# Patient Record
Sex: Female | Born: 1950 | Race: White | Hispanic: No | Marital: Married | Smoking: Former smoker
Health system: Southern US, Community
[De-identification: ages and names within clinical notes are randomized; demographics above are authoritative.]

## PROBLEM LIST (undated history)

## (undated) DIAGNOSIS — Z974 Presence of external hearing-aid: Secondary | ICD-10-CM

## (undated) DIAGNOSIS — E785 Hyperlipidemia, unspecified: Secondary | ICD-10-CM

## (undated) DIAGNOSIS — I10 Essential (primary) hypertension: Secondary | ICD-10-CM

---

## 2004-02-13 ENCOUNTER — Ambulatory Visit: Payer: Self-pay | Admitting: Internal Medicine

## 2004-02-15 ENCOUNTER — Ambulatory Visit: Payer: Self-pay | Admitting: Internal Medicine

## 2004-03-05 ENCOUNTER — Ambulatory Visit: Payer: Self-pay | Admitting: Internal Medicine

## 2005-03-20 ENCOUNTER — Ambulatory Visit: Payer: Self-pay | Admitting: Internal Medicine

## 2006-03-25 ENCOUNTER — Ambulatory Visit: Payer: Self-pay | Admitting: Internal Medicine

## 2006-04-17 ENCOUNTER — Ambulatory Visit: Payer: Self-pay | Admitting: Unknown Physician Specialty

## 2006-07-09 ENCOUNTER — Ambulatory Visit: Payer: Self-pay | Admitting: Internal Medicine

## 2006-07-11 ENCOUNTER — Ambulatory Visit: Payer: Self-pay | Admitting: Internal Medicine

## 2006-08-11 ENCOUNTER — Ambulatory Visit: Payer: Self-pay | Admitting: Internal Medicine

## 2007-07-22 ENCOUNTER — Ambulatory Visit: Payer: Self-pay | Admitting: Internal Medicine

## 2008-07-25 ENCOUNTER — Ambulatory Visit: Payer: Self-pay | Admitting: Internal Medicine

## 2009-07-26 ENCOUNTER — Ambulatory Visit: Payer: Self-pay | Admitting: Internal Medicine

## 2010-08-30 ENCOUNTER — Ambulatory Visit: Payer: Self-pay | Admitting: Internal Medicine

## 2011-09-02 ENCOUNTER — Ambulatory Visit: Payer: Self-pay | Admitting: Internal Medicine

## 2012-09-07 ENCOUNTER — Ambulatory Visit: Payer: Self-pay | Admitting: Internal Medicine

## 2013-10-06 ENCOUNTER — Ambulatory Visit: Payer: Self-pay | Admitting: Internal Medicine

## 2014-09-15 ENCOUNTER — Other Ambulatory Visit: Payer: Self-pay | Admitting: Internal Medicine

## 2014-09-15 DIAGNOSIS — Z1231 Encounter for screening mammogram for malignant neoplasm of breast: Secondary | ICD-10-CM

## 2014-10-11 ENCOUNTER — Ambulatory Visit
Admission: RE | Admit: 2014-10-11 | Discharge: 2014-10-11 | Disposition: A | Discharge status: Home / Self Care | Payer: BC Managed Care – PPO | Source: Ambulatory Visit | Attending: Internal Medicine | Admitting: Internal Medicine

## 2014-10-11 DIAGNOSIS — R928 Other abnormal and inconclusive findings on diagnostic imaging of breast: Secondary | ICD-10-CM | POA: Diagnosis not present

## 2014-10-11 DIAGNOSIS — Z1231 Encounter for screening mammogram for malignant neoplasm of breast: Secondary | ICD-10-CM | POA: Insufficient documentation

## 2014-10-13 ENCOUNTER — Other Ambulatory Visit: Payer: Self-pay | Admitting: Internal Medicine

## 2014-10-13 DIAGNOSIS — R921 Mammographic calcification found on diagnostic imaging of breast: Secondary | ICD-10-CM

## 2014-10-13 DIAGNOSIS — R928 Other abnormal and inconclusive findings on diagnostic imaging of breast: Secondary | ICD-10-CM

## 2014-10-20 ENCOUNTER — Other Ambulatory Visit: Payer: Self-pay | Admitting: Internal Medicine

## 2014-10-20 ENCOUNTER — Ambulatory Visit
Admission: RE | Admit: 2014-10-20 | Discharge: 2014-10-20 | Disposition: A | Discharge status: Home / Self Care | Payer: BC Managed Care – PPO | Source: Ambulatory Visit | Attending: Internal Medicine | Admitting: Internal Medicine

## 2014-10-20 ENCOUNTER — Ambulatory Visit: Payer: BC Managed Care – PPO

## 2014-10-20 DIAGNOSIS — R921 Mammographic calcification found on diagnostic imaging of breast: Secondary | ICD-10-CM

## 2014-10-20 DIAGNOSIS — R928 Other abnormal and inconclusive findings on diagnostic imaging of breast: Secondary | ICD-10-CM

## 2014-10-24 ENCOUNTER — Other Ambulatory Visit: Payer: Self-pay | Admitting: Internal Medicine

## 2014-10-24 DIAGNOSIS — R928 Other abnormal and inconclusive findings on diagnostic imaging of breast: Secondary | ICD-10-CM

## 2014-10-24 DIAGNOSIS — R921 Mammographic calcification found on diagnostic imaging of breast: Secondary | ICD-10-CM

## 2014-10-26 ENCOUNTER — Other Ambulatory Visit: Payer: Self-pay | Admitting: Surgery

## 2014-10-26 DIAGNOSIS — R928 Other abnormal and inconclusive findings on diagnostic imaging of breast: Secondary | ICD-10-CM

## 2014-10-26 DIAGNOSIS — R921 Mammographic calcification found on diagnostic imaging of breast: Secondary | ICD-10-CM

## 2014-10-30 ENCOUNTER — Ambulatory Visit: Payer: BC Managed Care – PPO

## 2014-11-06 ENCOUNTER — Other Ambulatory Visit: Payer: Self-pay | Admitting: Surgery

## 2014-11-06 ENCOUNTER — Ambulatory Visit
Admission: RE | Admit: 2014-11-06 | Discharge: 2014-11-06 | Disposition: A | Discharge status: Home / Self Care | Payer: BC Managed Care – PPO | Source: Ambulatory Visit | Attending: Surgery | Admitting: Surgery

## 2014-11-06 DIAGNOSIS — R928 Other abnormal and inconclusive findings on diagnostic imaging of breast: Secondary | ICD-10-CM

## 2014-11-06 DIAGNOSIS — R921 Mammographic calcification found on diagnostic imaging of breast: Secondary | ICD-10-CM

## 2014-11-06 DIAGNOSIS — R920 Mammographic microcalcification found on diagnostic imaging of breast: Secondary | ICD-10-CM | POA: Diagnosis present

## 2014-11-06 HISTORY — PX: BREAST BIOPSY: SHX20

## 2014-11-07 LAB — SURGICAL PATHOLOGY

## 2015-10-18 ENCOUNTER — Other Ambulatory Visit: Payer: Self-pay | Admitting: Internal Medicine

## 2015-10-18 DIAGNOSIS — Z1231 Encounter for screening mammogram for malignant neoplasm of breast: Secondary | ICD-10-CM

## 2015-11-01 ENCOUNTER — Other Ambulatory Visit: Payer: Self-pay | Admitting: Internal Medicine

## 2015-11-01 ENCOUNTER — Ambulatory Visit
Admission: RE | Admit: 2015-11-01 | Discharge: 2015-11-01 | Disposition: A | Discharge status: Home / Self Care | Payer: Medicare Other | Source: Ambulatory Visit | Attending: Internal Medicine | Admitting: Internal Medicine

## 2015-11-01 DIAGNOSIS — Z1231 Encounter for screening mammogram for malignant neoplasm of breast: Secondary | ICD-10-CM

## 2015-12-05 IMAGING — MG MM DIGITAL DIAGNOSTIC UNILAT*R*
2 series · 2 of 2 positions shown · non-contrast
Comparison: Previous exam(s).

CLINICAL DATA: Post right breast stereotactic biopsy.

EXAM:
DIAGNOSTIC RIGHT MAMMOGRAM POST STEREOTACTIC BIOPSY

[R CC]
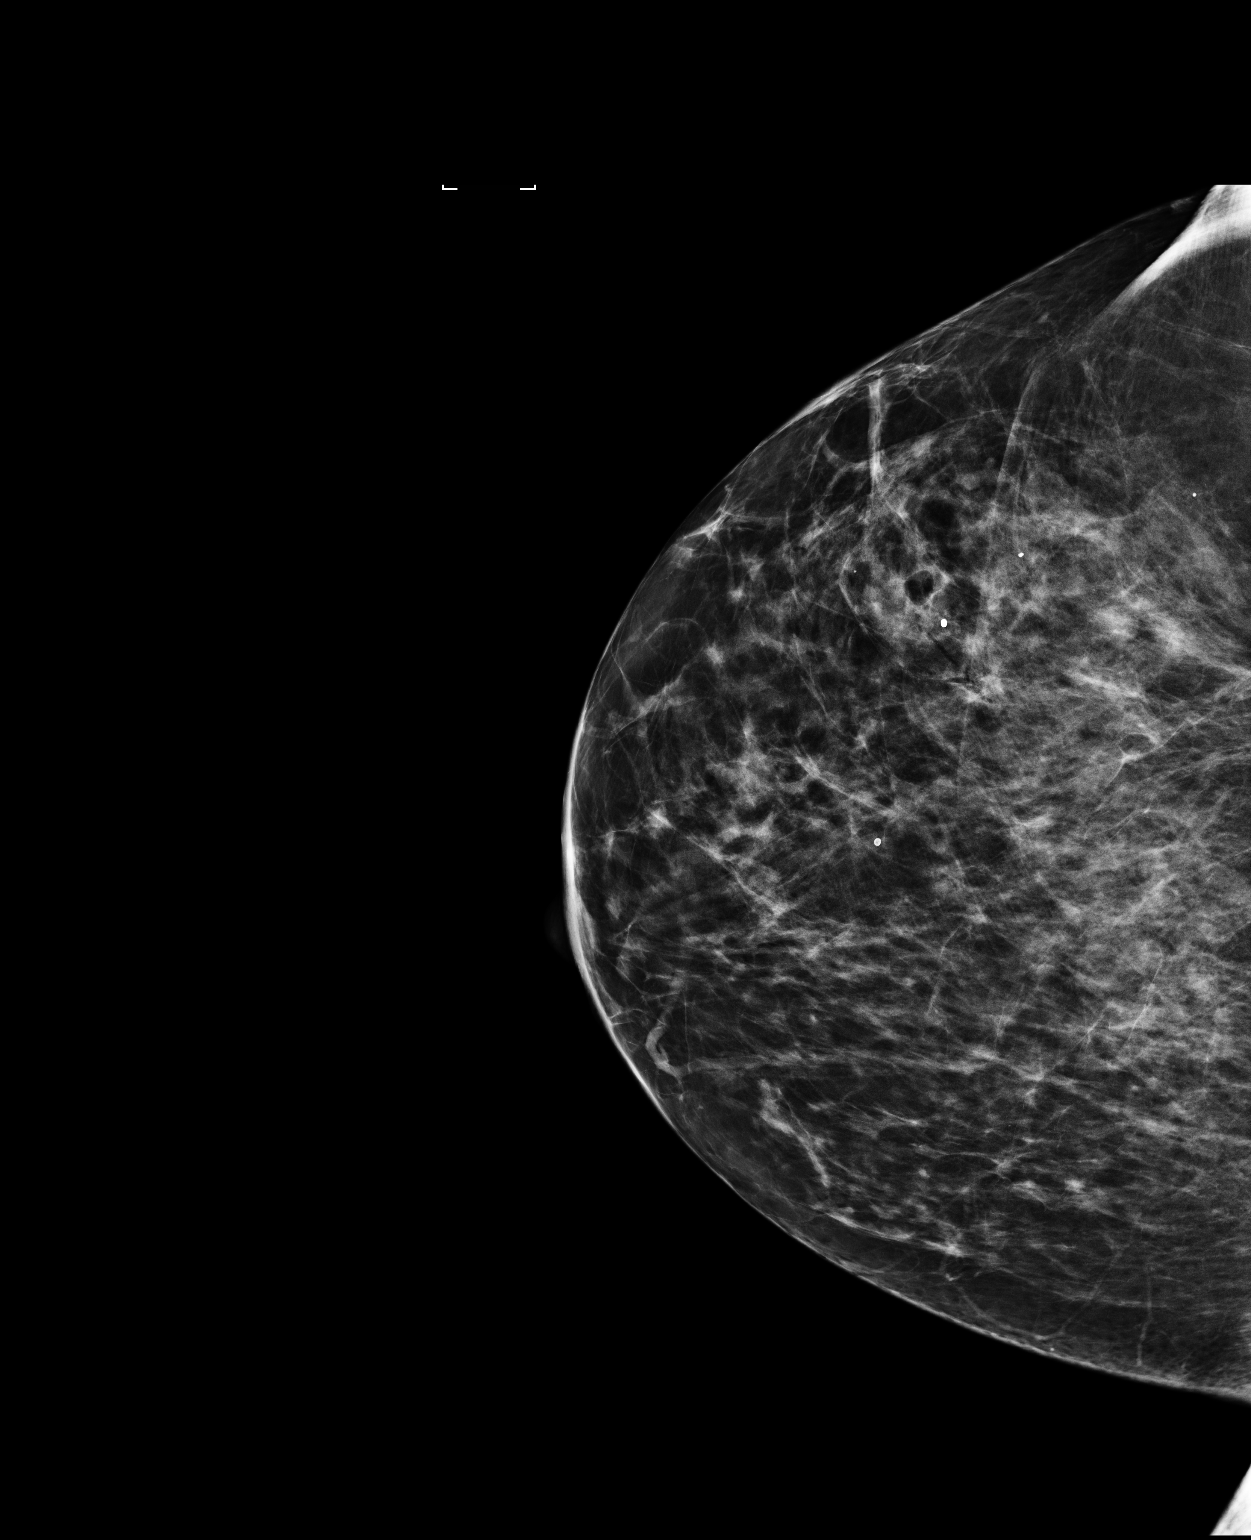

[R ML]
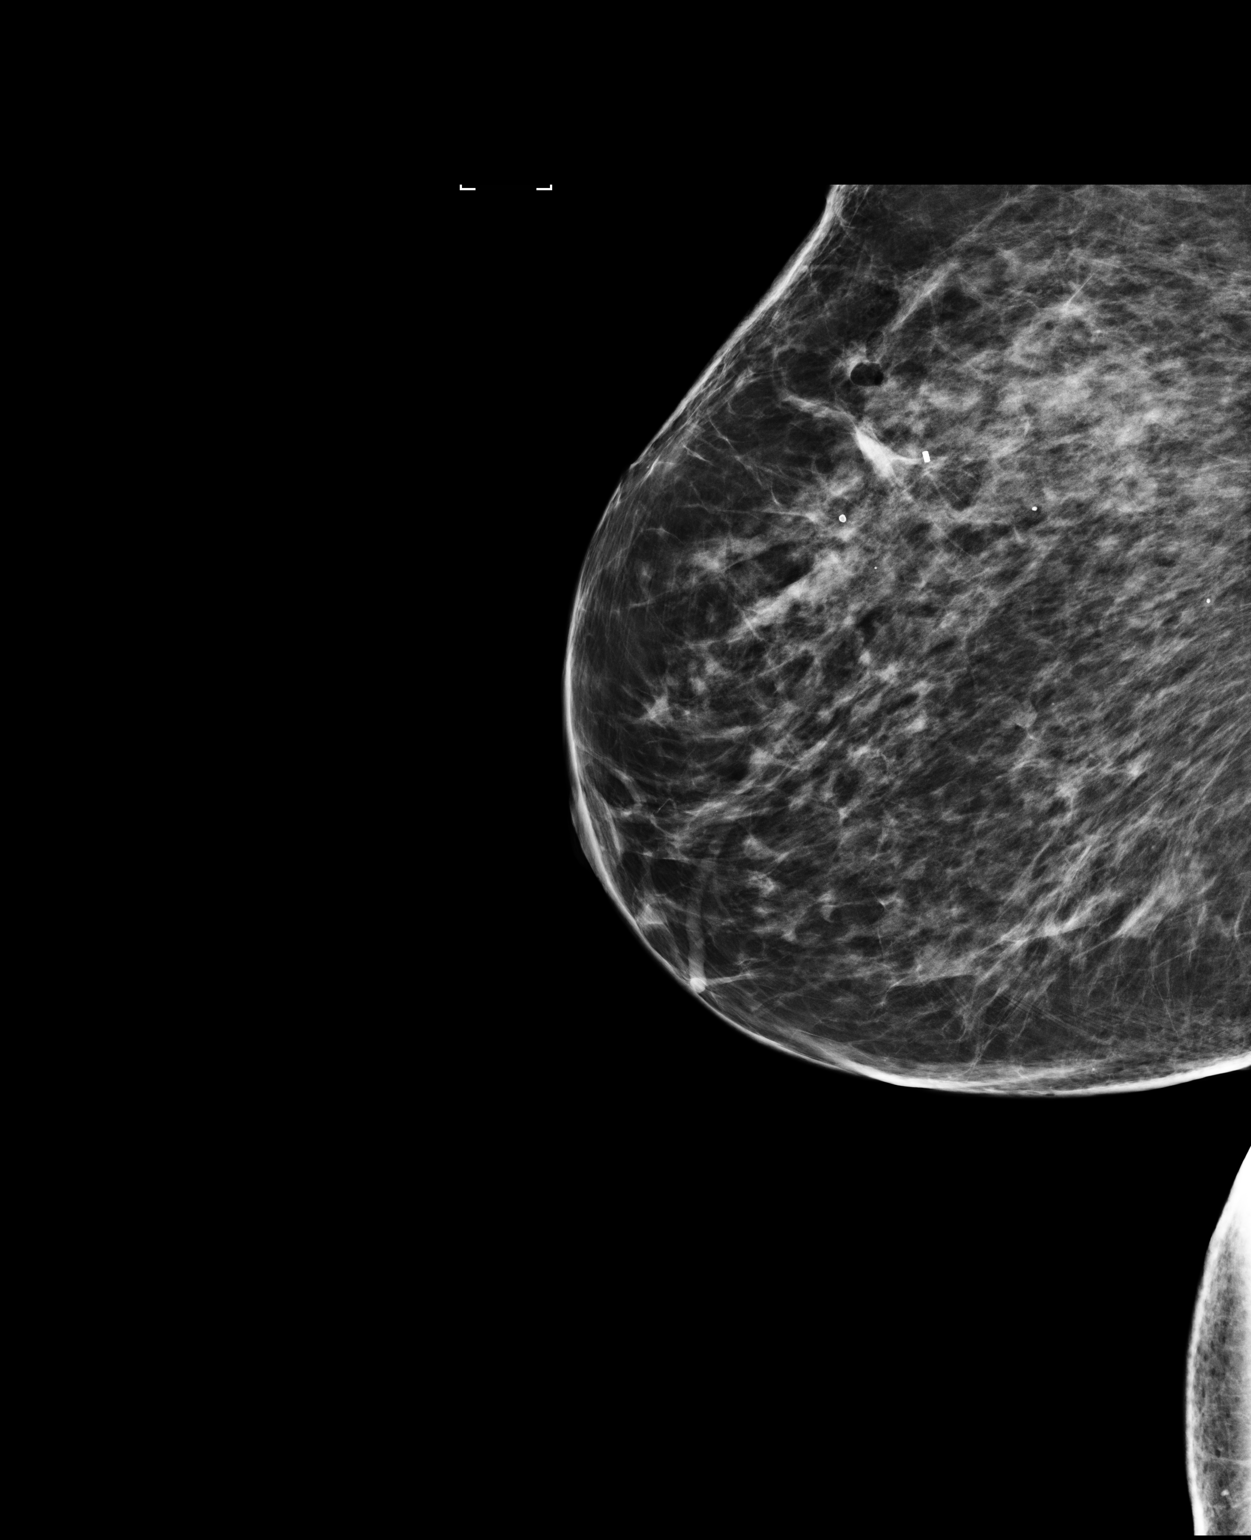

[2 of 2 positions shown; findings below may reference images not displayed]

FINDINGS: Mammographic images were obtained following stereotactic guided
biopsy of right breast calcifications. The cylinder-shaped clip is
in appropriate position
IMPRESSION: Appropriate positioning of clip following right breast stereotactic
biopsy.

Final Assessment: Post Procedure Mammograms for Marker Placement

## 2015-12-05 IMAGING — MG MM BREAST BX W/ LOC DEV 1ST LESION IMAGE BX SPEC STEREO GUIDE*R*
1 series · 1 of 1 positions shown · non-contrast
Comparison: Previous exams.

ADDENDUM:
Pathology of the right breast biopsy revealed FIBROCYSTIC CHANGES
WITH ASSOCIATED MICROCALCIFICATIONS. NEGATIVE FOR ATYPIA AND
MALIGNANCY. Comment: Fibrocystic changes include fibroadenomatous
change, usual ductal hyperplasia, adenosis, pseudo-angiomatous
stromal hyperplasia, apocrine metaplasia, and columnar cell change.
This was found to be concordant by Dr. Malagon.

At the patient's request, pathology and recommendations were relayed
to the patient by phone. She stated she is doing well following the
biopsy with no bleeding, bruising, or palpable hematoma at the
biopsy site. Post biopsy instructions were reviewed with the
patient. She was encouraged to call the [REDACTED] with any further questions or
concerns.
Recommendations: Recommend bilateral screening mammogram in one
year.
Pathology and recommendations relayed to the patient by Alhassan Seidu
[place], Mauro Teixeira on 11/07/14.
CLINICAL DATA: Suspicious right breast calcifications.
EXAM:
RIGHT BREAST STEREOTACTIC CORE NEEDLE BIOPSY

[R CC]
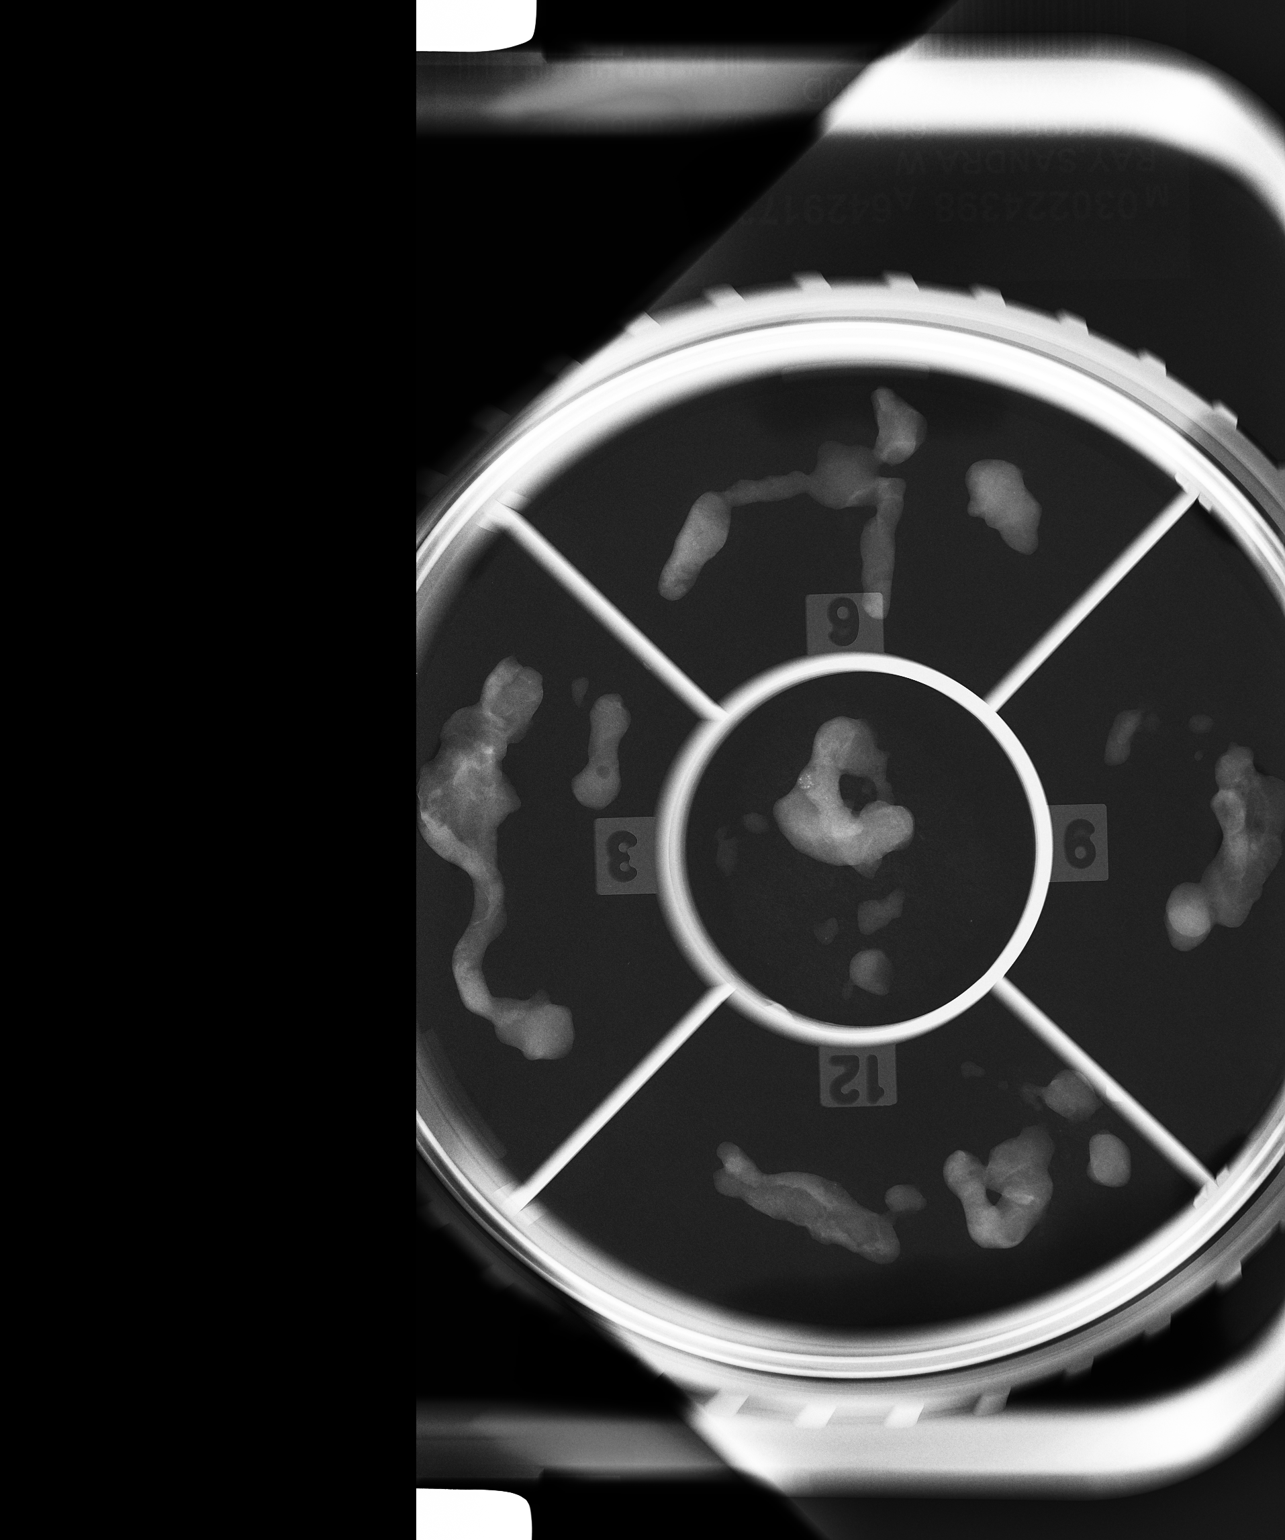

[1 of 1 positions shown; findings below may reference images not displayed]



Using sterile technique and 2% lidocaine as local anesthetic, under
stereotactic guidance, a 9 gauge vacuum assisted device was used to
perform core needle biopsy of calcifications within the upper-outer
quadrant of the right breast using a superior craniocaudal approach.
Specimen radiograph was performed showing representative
calcifications within the specimen. Specimens with calcifications
are identified for pathology.

At the conclusion of the procedure, a cylinder-shaped tissue marker
clip was deployed into the biopsy cavity. Follow-up 2-view mammogram
was performed and dictated separately.
IMPRESSION: Stereotactic-guided biopsy of right breast calcifications as
discussed above. No apparent complications.

## 2016-11-06 ENCOUNTER — Other Ambulatory Visit: Payer: Self-pay | Admitting: Internal Medicine

## 2016-11-06 DIAGNOSIS — Z1231 Encounter for screening mammogram for malignant neoplasm of breast: Secondary | ICD-10-CM

## 2016-11-20 ENCOUNTER — Ambulatory Visit
Admission: RE | Admit: 2016-11-20 | Discharge: 2016-11-20 | Disposition: A | Discharge status: Home / Self Care | Payer: Medicare Other | Source: Ambulatory Visit | Attending: Internal Medicine | Admitting: Internal Medicine

## 2016-11-20 DIAGNOSIS — Z1231 Encounter for screening mammogram for malignant neoplasm of breast: Secondary | ICD-10-CM

## 2018-02-05 ENCOUNTER — Other Ambulatory Visit: Payer: Self-pay | Admitting: Internal Medicine

## 2018-02-05 ENCOUNTER — Ambulatory Visit
Admission: RE | Admit: 2018-02-05 | Discharge: 2018-02-05 | Disposition: A | Discharge status: Home / Self Care | Payer: Medicare Other | Source: Ambulatory Visit | Attending: Internal Medicine | Admitting: Internal Medicine

## 2018-02-05 DIAGNOSIS — Z1231 Encounter for screening mammogram for malignant neoplasm of breast: Secondary | ICD-10-CM | POA: Diagnosis present

## 2019-03-01 ENCOUNTER — Other Ambulatory Visit: Payer: Self-pay | Admitting: Internal Medicine

## 2019-03-01 DIAGNOSIS — Z1231 Encounter for screening mammogram for malignant neoplasm of breast: Secondary | ICD-10-CM

## 2019-05-20 ENCOUNTER — Ambulatory Visit
Admission: RE | Admit: 2019-05-20 | Discharge: 2019-05-20 | Disposition: A | Discharge status: Home / Self Care | Payer: Medicare Other | Source: Ambulatory Visit | Attending: Internal Medicine | Admitting: Internal Medicine

## 2019-05-20 DIAGNOSIS — Z1231 Encounter for screening mammogram for malignant neoplasm of breast: Secondary | ICD-10-CM | POA: Insufficient documentation

## 2020-07-24 ENCOUNTER — Other Ambulatory Visit: Payer: Self-pay | Admitting: Internal Medicine

## 2020-07-24 DIAGNOSIS — Z1231 Encounter for screening mammogram for malignant neoplasm of breast: Secondary | ICD-10-CM

## 2020-08-10 ENCOUNTER — Other Ambulatory Visit: Payer: Self-pay

## 2020-08-10 ENCOUNTER — Ambulatory Visit
Admission: RE | Admit: 2020-08-10 | Discharge: 2020-08-10 | Disposition: A | Discharge status: Home / Self Care | Payer: Medicare Other | Source: Ambulatory Visit | Attending: Internal Medicine | Admitting: Internal Medicine

## 2020-08-10 DIAGNOSIS — Z1231 Encounter for screening mammogram for malignant neoplasm of breast: Secondary | ICD-10-CM

## 2021-04-01 LAB — COLOGUARD: COLOGUARD: NEGATIVE

## 2021-07-30 ENCOUNTER — Other Ambulatory Visit: Payer: Self-pay | Admitting: Internal Medicine

## 2021-07-30 DIAGNOSIS — Z1231 Encounter for screening mammogram for malignant neoplasm of breast: Secondary | ICD-10-CM

## 2021-08-27 ENCOUNTER — Ambulatory Visit
Admission: RE | Admit: 2021-08-27 | Discharge: 2021-08-27 | Disposition: A | Discharge status: Home / Self Care | Payer: Medicare Other | Source: Ambulatory Visit | Attending: Internal Medicine | Admitting: Internal Medicine

## 2021-08-27 DIAGNOSIS — Z1231 Encounter for screening mammogram for malignant neoplasm of breast: Secondary | ICD-10-CM | POA: Diagnosis present

## 2021-11-25 ENCOUNTER — Ambulatory Visit
Admission: EM | Admit: 2021-11-25 | Discharge: 2021-11-25 | Disposition: A | Discharge status: Home / Self Care | Payer: Medicare Other | Attending: Internal Medicine | Admitting: Internal Medicine

## 2021-11-25 DIAGNOSIS — N3000 Acute cystitis without hematuria: Secondary | ICD-10-CM | POA: Insufficient documentation

## 2021-11-25 LAB — URINALYSIS, ROUTINE W REFLEX MICROSCOPIC

## 2021-11-25 LAB — URINALYSIS, MICROSCOPIC (REFLEX): WBC, UA: >50 WBC/hpf (ref 0–5)

## 2021-11-25 MED ORDER — NITROFURANTOIN MONOHYD MACRO 100 MG PO CAPS: 100.0000 mg | ORAL_CAPSULE | Freq: Two times a day (BID) | ORAL | 0 refills | Status: DC

## 2021-11-25 NOTE — ED Provider Notes (Signed)
[facility]    CSN: [id number] Arrival date & time: 11/25/21  1844      History   Chief Complaint Chief Complaint  Patient presents with   Urinary Tract Infection    HPI Kelsey Hess is a 71 y.o. female who presents with urinary frequency and dysuria x 1 day. Last UTI April this year.     History reviewed. No pertinent past medical history.  There are no problems to display for this patient.   Past Surgical History:  Procedure Laterality Date   BREAST BIOPSY Right 11/06/2014   Stereo - negative    OB History   No obstetric history on file.      Home Medications    Prior to Admission medications   Medication Sig Start Date End Date Taking? Authorizing Provider  amLODipine (NORVASC) 10 MG tablet  11/25/21  Yes [provider]  calcium carbonate (OSCAL) 1500 (600 Ca) MG TABS tablet Take by mouth.   Yes [provider]  Cholecalciferol (VITAMIN D3) 10 MCG (400 UNIT) CAPS  11/25/21  Yes [provider]  losartan-hydrochlorothiazide (HYZAAR) 100-12.5 MG tablet  11/25/21  Yes [provider]  lovastatin (MEVACOR) 40 MG tablet TAKE 1 TABLET BY MOUTH  DAILY WITH DINNER 07/10/14  Yes [provider]  nitrofurantoin, macrocrystal-monohydrate, (MACROBID) 100 MG capsule Take 1 capsule (100 mg total) by mouth 2 (two) times daily. 11/25/21  Yes Rodriguez-Southworth, Nettie Elm, PA-C  vitamin B-12 (CYANOCOBALAMIN) 1000 MCG tablet Take by mouth.   Yes [provider]    Family History Family History  Problem Relation Age of Onset   Breast cancer Mother 65    Social History Social History   Tobacco Use   Smoking status: Never   Smokeless tobacco: Never  Vaping Use   Vaping Use: Never used  Substance Use Topics   Alcohol use: Yes   Drug use: Never     Allergies   Patient has no allergy information on record.   Review of Systems Review of Systems  Constitutional:  Negative for fever.  Genitourinary:   Positive for dysuria and frequency. Negative for flank pain.     Physical Exam Triage Vital Signs ED Triage Vitals  Enc Vitals Group     BP 11/25/21 1904 (!) 157/79     Pulse Rate 11/25/21 1904 94     Resp 11/25/21 1904 18     Temp 11/25/21 1904 98.4 F (36.9 C)     Temp Source 11/25/21 1904 Oral     SpO2 11/25/21 1904 96 %     Weight 11/25/21 1901 176 lb (79.8 kg)     Height 11/25/21 1901 4\' 11"  (1.499 m)     Head Circumference --      Peak Flow --      Pain Score 11/25/21 1901 8     Pain Loc --      Pain Edu? --      Excl. in GC? --    No data found.  Updated Vital Signs BP (!) 157/79 (BP Location: Left Arm)   Pulse 94   Temp 98.4 F (36.9 C) (Oral)   Resp 18   Ht 4\' 11"  (1.499 m)   Wt 176 lb (79.8 kg)   SpO2 96%   BMI 35.55 kg/m   Visual Acuity Right Eye Distance:   Left Eye Distance:   Bilateral Distance:    Right Eye Near:   Left Eye Near:    Bilateral Near:  Physical Exam Vitals and nursing note reviewed.  Constitutional:      General: She is not in acute distress.    Appearance: She is not toxic-appearing.  HENT:     Head: Normocephalic.     Right Ear: External ear normal.     Left Ear: External ear normal.  Eyes:     General: No scleral icterus.    Conjunctiva/sclera: Conjunctivae normal.  Pulmonary:     Effort: Pulmonary effort is normal.  Abdominal:     General: Bowel sounds are normal.     Palpations: Abdomen is soft. There is no mass.     Tenderness: There is no guarding or rebound.     Comments: - CVA tenderness   Musculoskeletal:        General: Normal range of motion.     Cervical back: Neck supple.      Skin:    General: Skin is warm and dry.     Findings: No rash.  Neurological:     Mental Status: She is alert and oriented to person, place, and time.     Gait: Gait normal.  Psychiatric:        Mood and Affect: Mood normal.        Behavior: Behavior normal.        Thought Content: Thought content normal.         Judgment: Judgment normal.    UC Treatments / Results  Labs (all labs ordered are listed, but only abnormal results are displayed) Labs Reviewed  URINALYSIS, ROUTINE W REFLEX MICROSCOPIC - Abnormal; Notable for the following components:      Result Value   Color, Urine ORANGE (*)    APPearance HAZY (*)    Glucose, UA   (*)    Value: TEST NOT REPORTED DUE TO COLOR INTERFERENCE OF URINE PIGMENT   Hgb urine dipstick   (*)    Value: TEST NOT REPORTED DUE TO COLOR INTERFERENCE OF URINE PIGMENT   Bilirubin Urine   (*)    Value: TEST NOT REPORTED DUE TO COLOR INTERFERENCE OF URINE PIGMENT   Ketones, ur   (*)    Value: TEST NOT REPORTED DUE TO COLOR INTERFERENCE OF URINE PIGMENT   Protein, ur   (*)    Value: TEST NOT REPORTED DUE TO COLOR INTERFERENCE OF URINE PIGMENT   Nitrite   (*)    Value: TEST NOT REPORTED DUE TO COLOR INTERFERENCE OF URINE PIGMENT   Leukocytes,Ua   (*)    Value: TEST NOT REPORTED DUE TO COLOR INTERFERENCE OF URINE PIGMENT   All other components within normal limits  URINALYSIS, MICROSCOPIC (REFLEX) - Abnormal; Notable for the following components:   Bacteria, UA MANY (*)    All other components within normal limits  URINE CULTURE    EKG   Radiology No results found.  Procedures Procedures (including critical care time)  Medications Ordered in UC Medications - No data to display  Initial Impression / Assessment and Plan / UC Course  I have reviewed the triage vital signs and the nursing notes.  Pertinent labs  results that were available during my care of the patient were reviewed by me and considered in my medical decision making (see chart for details).  UTI  Urine was sent out for a culture and we will call her if we need to change the medication. I checked her last labs from April this year and her creatinine was normal. I placed her on Macrobid as  noted.    Final Clinical Impressions(s) / UC Diagnoses   Final diagnoses:  Acute cystitis  without hematuria     Discharge Instructions      I will sent your urine for a culture, and if it comes back showing we need to change your antibiotic, we will let you know.      ED Prescriptions     Medication Sig Dispense Auth. Provider   nitrofurantoin, macrocrystal-monohydrate, (MACROBID) 100 MG capsule Take 1 capsule (100 mg total) by mouth 2 (two) times daily. 10 capsule Rodriguez-Southworth, Nettie Elm, PA-C      PDMP not reviewed this encounter.   Garey Ham, [place] 11/25/21 1926

## 2021-11-25 NOTE — Discharge Instructions (Addendum)
I will sent your urine for a culture, and if it comes back showing we need to change your antibiotic, we will let you know.

## 2021-11-25 NOTE — ED Triage Notes (Signed)
Pt c/o possible UTI.   Pt has been having urinary pain and frequency x1day

## 2021-11-28 LAB — URINE CULTURE: Culture: 20000 — AB

## 2022-02-12 ENCOUNTER — Encounter: Payer: Self-pay | Admitting: Ophthalmology

## 2022-02-17 NOTE — Discharge Instructions (Signed)

## 2022-02-19 ENCOUNTER — Ambulatory Visit: Payer: Medicare Other | Admitting: Anesthesiology

## 2022-02-19 ENCOUNTER — Encounter: Payer: Self-pay | Admitting: Ophthalmology

## 2022-02-19 ENCOUNTER — Other Ambulatory Visit: Payer: Self-pay

## 2022-02-19 ENCOUNTER — Ambulatory Visit
Admission: RE | Admit: 2022-02-19 | Discharge: 2022-02-19 | Disposition: A | Discharge status: Home / Self Care | Payer: Medicare Other | Attending: Ophthalmology | Admitting: Ophthalmology

## 2022-02-19 ENCOUNTER — Encounter
Admission: RE | Disposition: A | Discharge status: Home / Self Care | Payer: Self-pay | Source: Home / Self Care | Attending: Ophthalmology

## 2022-02-19 DIAGNOSIS — E669 Obesity, unspecified: Secondary | ICD-10-CM | POA: Diagnosis not present

## 2022-02-19 DIAGNOSIS — Z6835 Body mass index (BMI) 35.0-35.9, adult: Secondary | ICD-10-CM | POA: Diagnosis not present

## 2022-02-19 DIAGNOSIS — I10 Essential (primary) hypertension: Secondary | ICD-10-CM | POA: Diagnosis not present

## 2022-02-19 DIAGNOSIS — H2511 Age-related nuclear cataract, right eye: Secondary | ICD-10-CM | POA: Insufficient documentation

## 2022-02-19 DIAGNOSIS — E785 Hyperlipidemia, unspecified: Secondary | ICD-10-CM | POA: Diagnosis not present

## 2022-02-19 DIAGNOSIS — Z87891 Personal history of nicotine dependence: Secondary | ICD-10-CM | POA: Diagnosis not present

## 2022-02-19 HISTORY — DX: Essential (primary) hypertension: I10

## 2022-02-19 HISTORY — DX: Hyperlipidemia, unspecified: E78.5

## 2022-02-19 HISTORY — DX: Presence of external hearing-aid: Z97.4

## 2022-02-19 HISTORY — PX: CATARACT EXTRACTION W/PHACO: SHX586

## 2022-02-19 SURGERY — PHACOEMULSIFICATION, CATARACT, WITH IOL INSERTION
Anesthesia: Monitor Anesthesia Care | Site: Eye | Laterality: Right

## 2022-02-19 MED ORDER — SIGHTPATH DOSE#1 BSS IO SOLN
INTRAOCULAR | Status: DC | PRN
  Administered 2022-02-19: 67 mL via OPHTHALMIC

## 2022-02-19 MED ORDER — SIGHTPATH DOSE#1 BSS IO SOLN
INTRAOCULAR | Status: DC | PRN
  Administered 2022-02-19: 1 mL via INTRAMUSCULAR

## 2022-02-19 MED ORDER — SIGHTPATH DOSE#1 BSS IO SOLN
INTRAOCULAR | Status: DC | PRN
  Administered 2022-02-19: 15 mL

## 2022-02-19 MED ORDER — TETRACAINE HCL 0.5 % OP SOLN
1.0000 [drp] | OPHTHALMIC | Status: DC | PRN
  Administered 2022-02-19 (×3): 1 [drp] via OPHTHALMIC

## 2022-02-19 MED ORDER — BRIMONIDINE TARTRATE-TIMOLOL 0.2-0.5 % OP SOLN
OPHTHALMIC | Status: DC | PRN
  Administered 2022-02-19: 1 [drp] via OPHTHALMIC

## 2022-02-19 MED ORDER — MIDAZOLAM HCL 2 MG/2ML IJ SOLN
INTRAMUSCULAR | Status: DC | PRN
  Administered 2022-02-19: 2 mg via INTRAVENOUS

## 2022-02-19 MED ORDER — CEFUROXIME OPHTHALMIC INJECTION 1 MG/0.1 ML
INJECTION | OPHTHALMIC | Status: DC | PRN
  Administered 2022-02-19: 1 mg via OPHTHALMIC

## 2022-02-19 MED ORDER — SIGHTPATH DOSE#1 NA HYALUR & NA CHOND-NA HYALUR IO KIT
PACK | INTRAOCULAR | Status: DC | PRN
  Administered 2022-02-19: 1 via OPHTHALMIC

## 2022-02-19 MED ORDER — ARMC OPHTHALMIC DILATING DROPS
1.0000 | OPHTHALMIC | Status: DC | PRN
  Administered 2022-02-19 (×3): 1 via OPHTHALMIC

## 2022-02-19 MED ORDER — FENTANYL CITRATE (PF) 100 MCG/2ML IJ SOLN
INTRAMUSCULAR | Status: DC | PRN
  Administered 2022-02-19: 50 ug via INTRAVENOUS

## 2022-02-19 SURGICAL SUPPLY — 10 items
CATARACT SUITE SIGHTPATH (MISCELLANEOUS) ×1 IMPLANT
FEE CATARACT SUITE SIGHTPATH (MISCELLANEOUS) ×1 IMPLANT
GLOVE SRG 8 PF TXTR STRL LF DI (GLOVE) ×1 IMPLANT
GLOVE SURG ENC TEXT LTX SZ7.5 (GLOVE) ×1 IMPLANT
GLOVE SURG UNDER POLY LF SZ8 (GLOVE) ×1
LENS IOL TECNIS EYHANCE 19.0 (Intraocular Lens) IMPLANT
NDL FILTER BLUNT 18X1 1/2 (NEEDLE) ×1 IMPLANT
NEEDLE FILTER BLUNT 18X1 1/2 (NEEDLE) ×1 IMPLANT
SYR 3ML LL SCALE MARK (SYRINGE) ×1 IMPLANT
WATER STERILE IRR 250ML POUR (IV SOLUTION) ×1 IMPLANT

## 2022-02-19 NOTE — Anesthesia Preprocedure Evaluation (Addendum)
Anesthesia Evaluation  Patient identified by MRN, date of birth, ID band Patient awake    Reviewed: Allergy & Precautions, NPO status , Patient's Chart, lab work & pertinent test results  Airway Mallampati: III  TM Distance: >3 FB Neck ROM: full    Dental no notable dental hx.    Pulmonary neg pulmonary ROS, former smoker,    Pulmonary exam normal        Cardiovascular Exercise Tolerance: Good hypertension, Normal cardiovascular exam     Neuro/Psych negative neurological ROS  negative psych ROS   GI/Hepatic negative GI ROS, Neg liver ROS,   Endo/Other  negative endocrine ROS  Renal/GU      Musculoskeletal   Abdominal (+) + obese,   Peds  Hematology negative hematology ROS (+)   Anesthesia Other Findings Past Medical History: No date: Hyperlipidemia No date: Hypertension No date: Wears hearing aid in both ears  Past Surgical History: 11/06/2014: BREAST BIOPSY; Right     Comment:  Stereo - negative  BMI    Body Mass Index: 35.55 kg/m      Reproductive/Obstetrics negative OB ROS                            Anesthesia Physical Anesthesia Plan  ASA: 2  Anesthesia Plan: MAC   Post-op Pain Management:    Induction:   PONV Risk Score and Plan:   Airway Management Planned:   Additional Equipment:   Intra-op Plan:   Post-operative Plan:   Informed Consent: I have reviewed the patients History and Physical, chart, labs and discussed the procedure including the risks, benefits and alternatives for the proposed anesthesia with the patient or authorized representative who has indicated his/her understanding and acceptance.       Plan Discussed with: Anesthesiologist, CRNA and Surgeon  Anesthesia Plan Comments:        Anesthesia Quick Evaluation

## 2022-02-19 NOTE — Transfer of Care (Signed)
Immediate Anesthesia Transfer of Care Note  Patient: Kelsey Hess  Procedure(s) Performed: CATARACT EXTRACTION PHACO AND INTRAOCULAR LENS PLACEMENT (IOC) RIGHT (Right: Eye)  Patient Location: PACU  Anesthesia Type: MAC  Level of Consciousness: awake, alert  and patient cooperative  Airway and Oxygen Therapy: Patient Spontanous Breathing and Patient connected to supplemental oxygen  Post-op Assessment: Post-op Vital signs reviewed, Patient's Cardiovascular Status Stable, Respiratory Function Stable, Patent Airway and No signs of Nausea or vomiting  Post-op Vital Signs: Reviewed and stable  Complications: There were no known notable events for this encounter.

## 2022-02-19 NOTE — Op Note (Signed)
LOCATION:  [place]   PREOPERATIVE DIAGNOSIS:    Nuclear sclerotic cataract right eye. H25.11   POSTOPERATIVE DIAGNOSIS:  Nuclear sclerotic cataract right eye.     PROCEDURE:  Phacoemusification with posterior chamber intraocular lens placement of the right eye   ULTRASOUND TIME: Procedure(s) with comments: CATARACT EXTRACTION PHACO AND INTRAOCULAR LENS PLACEMENT (IOC) RIGHT (Right) - 8.02 01:17.9  LENS:   Implant Name Type Inv. Item Serial No. Manufacturer Lot No. LRB No. Used Action  LENS IOL TECNIS EYHANCE 19.0 - [id number] Intraocular Lens LENS IOL TECNIS EYHANCE 19.0 [id number] [employer]  Right 1 Implanted         SURGEON:  Wyonia Hough, MD   ANESTHESIA:  Topical with tetracaine drops and 2% Xylocaine jelly, augmented with 1% preservative-free intracameral lidocaine.    COMPLICATIONS:  None.   DESCRIPTION OF PROCEDURE:  The patient was identified in the holding room and transported to the operating room and placed in the supine position under the operating microscope.  The right eye was identified as the operative eye and it was prepped and draped in the usual sterile ophthalmic fashion.   A 1 millimeter clear-corneal paracentesis was made at the 12:00 position.  0.5 ml of preservative-free 1% lidocaine was injected into the anterior chamber. The anterior chamber was filled with Viscoat viscoelastic.  A 2.4 millimeter keratome was used to make a near-clear corneal incision at the 9:00 position.  A curvilinear capsulorrhexis was made with a cystotome and capsulorrhexis forceps.  Balanced salt solution was used to hydrodissect and hydrodelineate the nucleus.   Phacoemulsification was then used in stop and chop fashion to remove the lens nucleus and epinucleus.  The remaining cortex was then removed using the irrigation and aspiration handpiece. Provisc was then placed into the capsular bag to distend it for lens placement.  A lens was then injected into the  capsular bag.  The remaining viscoelastic was aspirated.   Wounds were hydrated with balanced salt solution.  The anterior chamber was inflated to a physiologic pressure with balanced salt solution.  No wound leaks were noted. Cefuroxime 0.1 ml of a 10mg /ml solution was injected into the anterior chamber for a dose of 1 mg of intracameral antibiotic at the completion of the case.   Timolol and Brimonidine drops and Maxitrol ointment were applied to the eye.  The patient was taken to the recovery room in stable condition without complications of anesthesia or surgery.   Teirra Carapia 02/19/2022, 11:51 AM

## 2022-02-19 NOTE — H&P (Signed)
  [facility]   Primary Care Physician:  Adin Hector, MD Ophthalmologist: Dr. Leandrew Koyanagi  Pre-Procedure History & Physical: HPI:  Kelsey Hess is a 71 y.o. female here for ophthalmic surgery.   Past Medical History:  Diagnosis Date   Hyperlipidemia    Hypertension    Wears hearing aid in both ears     Past Surgical History:  Procedure Laterality Date   BREAST BIOPSY Right 11/06/2014   Stereo - negative    Prior to Admission medications   Medication Sig Start Date End Date Taking? Authorizing Provider  amLODipine (NORVASC) 10 MG tablet  11/25/21  Yes [provider]  calcium carbonate (OSCAL) 1500 (600 Ca) MG TABS tablet Take by mouth.   Yes [provider]  Cholecalciferol (VITAMIN D3) 10 MCG (400 UNIT) CAPS Take 50 mcg by mouth daily. 11/25/21  Yes [provider]  losartan-hydrochlorothiazide (HYZAAR) 100-12.5 MG tablet  11/25/21  Yes [provider]  lovastatin (MEVACOR) 40 MG tablet TAKE 1 TABLET BY MOUTH  DAILY WITH DINNER 07/10/14  Yes [provider]  vitamin B-12 (CYANOCOBALAMIN) 1000 MCG tablet Take by mouth.   Yes [provider]    Allergies as of 11/27/2021   (Not on File)    Family History  Problem Relation Age of Onset   Breast cancer Mother 70    Social History   Socioeconomic History   Marital status: Married    Spouse name: Not on file   Number of children: Not on file   Years of education: Not on file   Highest education level: Not on file  Occupational History   Not on file  Tobacco Use   Smoking status: Former    Types: Cigarettes    Quit date: 2015    Years since quitting: 8.7   Smokeless tobacco: Never  Vaping Use   Vaping Use: Never used  Substance and Sexual Activity   Alcohol use: Yes    Alcohol/week: 7.0 standard drinks of alcohol    Types: 7 Standard drinks or equivalent per week   Drug use: Never   Sexual activity: Not on file  Other Topics Concern    Not on file  Social History Narrative   Not on file   Social Determinants of Health   Financial Resource Strain: Not on file  Food Insecurity: Not on file  Transportation Needs: Not on file  Physical Activity: Not on file  Stress: Not on file  Social Connections: Not on file  Intimate Partner Violence: Not on file    Review of Systems: See HPI, otherwise negative ROS  Physical Exam: BP 136/73   Pulse 70   Temp (!) 97 F (36.1 C) (Temporal)   Resp 18   Ht 4\' 11"  (1.499 m)   Wt 78.9 kg   SpO2 95%   BMI 35.14 kg/m  General:   Alert,  pleasant and cooperative in NAD Head:  Normocephalic and atraumatic. Lungs:  Clear to auscultation.    Heart:  Regular rate and rhythm.   Impression/Plan: Kelsey Hess is here for ophthalmic surgery.  Risks, benefits, limitations, and alternatives regarding ophthalmic surgery have been reviewed with the patient.  Questions have been answered.  All parties agreeable.   Leandrew Koyanagi, MD  02/19/2022, 11:00 AM

## 2022-02-19 NOTE — Anesthesia Postprocedure Evaluation (Signed)
Anesthesia Post Note  Patient: Kelsey Hess  Procedure(s) Performed: CATARACT EXTRACTION PHACO AND INTRAOCULAR LENS PLACEMENT (IOC) RIGHT (Right: Eye)  Patient location during evaluation: PACU Anesthesia Type: MAC Level of consciousness: awake and alert Pain management: pain level controlled Vital Signs Assessment: post-procedure vital signs reviewed and stable Respiratory status: spontaneous breathing, nonlabored ventilation and respiratory function stable Cardiovascular status: blood pressure returned to baseline and stable Postop Assessment: no apparent nausea or vomiting Anesthetic complications: no   There were no known notable events for this encounter.   Last Vitals:  Vitals:   02/19/22 1157 02/19/22 1158  BP:  116/63  Pulse: (!) 59 62  Resp: 16 15  Temp:    SpO2: 96% 97%    Last Pain:  Vitals:   02/19/22 1158  TempSrc:   PainSc: 0-No pain                 [place] Ouch

## 2022-02-20 ENCOUNTER — Encounter: Payer: Self-pay | Admitting: Ophthalmology

## 2022-03-04 NOTE — Discharge Instructions (Signed)

## 2022-03-05 ENCOUNTER — Encounter
Admission: RE | Disposition: A | Discharge status: Home / Self Care | Payer: Self-pay | Source: Home / Self Care | Attending: Ophthalmology

## 2022-03-05 ENCOUNTER — Ambulatory Visit: Payer: Medicare Other | Admitting: Anesthesiology

## 2022-03-05 ENCOUNTER — Ambulatory Visit
Admission: RE | Admit: 2022-03-05 | Discharge: 2022-03-05 | Disposition: A | Discharge status: Home / Self Care | Payer: Medicare Other | Attending: Ophthalmology | Admitting: Ophthalmology

## 2022-03-05 ENCOUNTER — Encounter: Payer: Self-pay | Admitting: Ophthalmology

## 2022-03-05 ENCOUNTER — Other Ambulatory Visit: Payer: Self-pay

## 2022-03-05 DIAGNOSIS — Z87891 Personal history of nicotine dependence: Secondary | ICD-10-CM | POA: Insufficient documentation

## 2022-03-05 DIAGNOSIS — I10 Essential (primary) hypertension: Secondary | ICD-10-CM | POA: Insufficient documentation

## 2022-03-05 DIAGNOSIS — H2512 Age-related nuclear cataract, left eye: Secondary | ICD-10-CM | POA: Insufficient documentation

## 2022-03-05 HISTORY — PX: CATARACT EXTRACTION W/PHACO: SHX586

## 2022-03-05 SURGERY — PHACOEMULSIFICATION, CATARACT, WITH IOL INSERTION
Anesthesia: Monitor Anesthesia Care | Site: Eye | Laterality: Left

## 2022-03-05 MED ORDER — SIGHTPATH DOSE#1 BSS IO SOLN
INTRAOCULAR | Status: DC | PRN
  Administered 2022-03-05: 15 mL

## 2022-03-05 MED ORDER — BRIMONIDINE TARTRATE-TIMOLOL 0.2-0.5 % OP SOLN
OPHTHALMIC | Status: DC | PRN
  Administered 2022-03-05: 1 [drp] via OPHTHALMIC

## 2022-03-05 MED ORDER — SIGHTPATH DOSE#1 NA HYALUR & NA CHOND-NA HYALUR IO KIT
PACK | INTRAOCULAR | Status: DC | PRN
  Administered 2022-03-05: 1 via OPHTHALMIC

## 2022-03-05 MED ORDER — LACTATED RINGERS IV SOLN: INTRAVENOUS | Status: DC

## 2022-03-05 MED ORDER — FENTANYL CITRATE (PF) 100 MCG/2ML IJ SOLN
INTRAMUSCULAR | Status: DC | PRN
  Administered 2022-03-05 (×2): 50 ug via INTRAVENOUS

## 2022-03-05 MED ORDER — TETRACAINE HCL 0.5 % OP SOLN
1.0000 [drp] | OPHTHALMIC | Status: DC | PRN
  Administered 2022-03-05 (×3): 1 [drp] via OPHTHALMIC

## 2022-03-05 MED ORDER — CEFUROXIME OPHTHALMIC INJECTION 1 MG/0.1 ML
INJECTION | OPHTHALMIC | Status: DC | PRN
  Administered 2022-03-05: 0.1 mL via INTRACAMERAL

## 2022-03-05 MED ORDER — MIDAZOLAM HCL 2 MG/2ML IJ SOLN
INTRAMUSCULAR | Status: DC | PRN
  Administered 2022-03-05: 1 mg via INTRAVENOUS

## 2022-03-05 MED ORDER — SIGHTPATH DOSE#1 BSS IO SOLN
INTRAOCULAR | Status: DC | PRN
  Administered 2022-03-05: 1 mL via INTRAMUSCULAR

## 2022-03-05 MED ORDER — ARMC OPHTHALMIC DILATING DROPS
1.0000 | OPHTHALMIC | Status: DC | PRN
  Administered 2022-03-05 (×3): 1 via OPHTHALMIC

## 2022-03-05 SURGICAL SUPPLY — 10 items
CATARACT SUITE SIGHTPATH (MISCELLANEOUS) ×1 IMPLANT
FEE CATARACT SUITE SIGHTPATH (MISCELLANEOUS) ×1 IMPLANT
GLOVE SRG 8 PF TXTR STRL LF DI (GLOVE) ×1 IMPLANT
GLOVE SURG ENC TEXT LTX SZ7.5 (GLOVE) ×1 IMPLANT
GLOVE SURG UNDER POLY LF SZ8 (GLOVE) ×1
LENS IOL TECNIS EYHANCE 20.0 (Intraocular Lens) IMPLANT
NDL FILTER BLUNT 18X1 1/2 (NEEDLE) ×1 IMPLANT
NEEDLE FILTER BLUNT 18X1 1/2 (NEEDLE) ×1 IMPLANT
SYR 3ML LL SCALE MARK (SYRINGE) ×1 IMPLANT
WATER STERILE IRR 250ML POUR (IV SOLUTION) ×1 IMPLANT

## 2022-03-05 NOTE — Anesthesia Preprocedure Evaluation (Signed)
Anesthesia Evaluation  Patient identified by MRN, date of birth, ID band Patient awake    Reviewed: Allergy & Precautions, NPO status , Patient's Chart, lab work & pertinent test results  Airway Mallampati: III  TM Distance: >3 FB Neck ROM: full    Dental no notable dental hx.    Pulmonary neg pulmonary ROS, former smoker,    Pulmonary exam normal        Cardiovascular Exercise Tolerance: Good hypertension, Normal cardiovascular exam     Neuro/Psych negative neurological ROS  negative psych ROS   GI/Hepatic negative GI ROS, Neg liver ROS,   Endo/Other  negative endocrine ROS  Renal/GU      Musculoskeletal   Abdominal (+) + obese,   Peds  Hematology negative hematology ROS (+)   Anesthesia Other Findings Past Medical History: No date: Hyperlipidemia No date: Hypertension No date: Wears hearing aid in both ears  Past Surgical History: 11/06/2014: BREAST BIOPSY; Right     Comment:  Stereo - negative  BMI    Body Mass Index: 35.55 kg/m      Reproductive/Obstetrics negative OB ROS                             Anesthesia Physical  Anesthesia Plan  ASA: 2  Anesthesia Plan: MAC   Post-op Pain Management:    Induction:   PONV Risk Score and Plan:   Airway Management Planned:   Additional Equipment:   Intra-op Plan:   Post-operative Plan:   Informed Consent: I have reviewed the patients History and Physical, chart, labs and discussed the procedure including the risks, benefits and alternatives for the proposed anesthesia with the patient or authorized representative who has indicated his/her understanding and acceptance.       Plan Discussed with: Anesthesiologist, CRNA and Surgeon  Anesthesia Plan Comments:         Anesthesia Quick Evaluation

## 2022-03-05 NOTE — Anesthesia Postprocedure Evaluation (Signed)
Anesthesia Post Note  Patient: Kelsey Hess  Procedure(s) Performed: CATARACT EXTRACTION PHACO AND INTRAOCULAR LENS PLACEMENT (IOC) LEFT 5.54 00:56.8 (Left: Eye)  Patient location during evaluation: PACU Anesthesia Type: MAC Level of consciousness: awake and alert Pain management: pain level controlled Vital Signs Assessment: post-procedure vital signs reviewed and stable Respiratory status: spontaneous breathing, nonlabored ventilation, respiratory function stable and patient connected to nasal cannula oxygen Cardiovascular status: stable and blood pressure returned to baseline Postop Assessment: no apparent nausea or vomiting Anesthetic complications: no   There were no known notable events for this encounter.   Last Vitals:  Vitals:   03/05/22 1351 03/05/22 1355  BP: 128/66 135/75  Pulse: (!) 58 (!) 56  Resp: 14 17  Temp: (!) 36.4 C   SpO2: 97% 97%    Last Pain:  Vitals:   03/05/22 1355  TempSrc:   PainSc: 0-No pain                 Martha Clan

## 2022-03-05 NOTE — Transfer of Care (Signed)
Immediate Anesthesia Transfer of Care Note  Patient: Kelsey Hess  Procedure(s) Performed: CATARACT EXTRACTION PHACO AND INTRAOCULAR LENS PLACEMENT (IOC) LEFT 5.54 00:56.8 (Left: Eye)  Patient Location: PACU  Anesthesia Type: MAC  Level of Consciousness: awake, alert  and patient cooperative  Airway and Oxygen Therapy: Patient Spontanous Breathing and Patient connected to supplemental oxygen  Post-op Assessment: Post-op Vital signs reviewed, Patient's Cardiovascular Status Stable, Respiratory Function Stable, Patent Airway and No signs of Nausea or vomiting  Post-op Vital Signs: Reviewed and stable  Complications: There were no known notable events for this encounter.

## 2022-03-05 NOTE — Op Note (Signed)
OPERATIVE NOTE  RACHYL WUEBKER [id number] 03/05/2022   PREOPERATIVE DIAGNOSIS:  Nuclear sclerotic cataract left eye. H25.12   POSTOPERATIVE DIAGNOSIS:    Nuclear sclerotic cataract left eye.     PROCEDURE:  Phacoemusification with posterior chamber intraocular lens placement of the left eye  Ultrasound time: Procedure(s): CATARACT EXTRACTION PHACO AND INTRAOCULAR LENS PLACEMENT (IOC) LEFT 5.54 00:56.8 (Left)  LENS:   Implant Name Type Inv. Item Serial No. Manufacturer Lot No. LRB No. Used Action  LENS IOL TECNIS EYHANCE 20.0 - [id number] Intraocular Lens LENS IOL TECNIS EYHANCE 20.0 [id number] [employer]  Left 1 Implanted      SURGEON:  Wyonia Hough, MD   ANESTHESIA:  Topical with tetracaine drops and 2% Xylocaine jelly, augmented with 1% preservative-free intracameral lidocaine.    COMPLICATIONS:  None.   DESCRIPTION OF PROCEDURE:  The patient was identified in the holding room and transported to the operating room and placed in the supine position under the operating microscope.  The left eye was identified as the operative eye and it was prepped and draped in the usual sterile ophthalmic fashion.   A 1 millimeter clear-corneal paracentesis was made at the 1:30 position.  0.5 ml of preservative-free 1% lidocaine was injected into the anterior chamber.  The anterior chamber was filled with Viscoat viscoelastic.  A 2.4 millimeter keratome was used to make a near-clear corneal incision at the 10:30 position.  .  A curvilinear capsulorrhexis was made with a cystotome and capsulorrhexis forceps.  Balanced salt solution was used to hydrodissect and hydrodelineate the nucleus.   Phacoemulsification was then used in stop and chop fashion to remove the lens nucleus and epinucleus.  The remaining cortex was then removed using the irrigation and aspiration handpiece. Provisc was then placed into the capsular bag to distend it for lens placement.  A lens was then injected into the  capsular bag.  The remaining viscoelastic was aspirated.   Wounds were hydrated with balanced salt solution.  The anterior chamber was inflated to a physiologic pressure with balanced salt solution.  No wound leaks were noted. Cefuroxime 0.1 ml of a 10mg /ml solution was injected into the anterior chamber for a dose of 1 mg of intracameral antibiotic at the completion of the case.   Timolol and Brimonidine drops were applied to the eye.  The patient was taken to the recovery room in stable condition without complications of anesthesia or surgery.  Kendal Ghazarian 03/05/2022, 1:48 PM

## 2022-03-05 NOTE — H&P (Signed)
  [facility]   Primary Care Physician:  Adin Hector, MD Ophthalmologist: Dr. Leandrew Koyanagi  Pre-Procedure History & Physical: HPI:  Kelsey Hess is a 71 y.o. female here for ophthalmic surgery.   Past Medical History:  Diagnosis Date   Hyperlipidemia    Hypertension    Wears hearing aid in both ears     Past Surgical History:  Procedure Laterality Date   BREAST BIOPSY Right 11/06/2014   Stereo - negative   CATARACT EXTRACTION W/PHACO Right 02/19/2022   Procedure: CATARACT EXTRACTION PHACO AND INTRAOCULAR LENS PLACEMENT ([place]) RIGHT;  Surgeon: Leandrew Koyanagi, MD;  Location: [place];  Service: Ophthalmology;  Laterality: Right;  8.02 01:17.9    Prior to Admission medications   Medication Sig Start Date End Date Taking? Authorizing Provider  amLODipine (NORVASC) 10 MG tablet  11/25/21   [provider]  calcium carbonate (OSCAL) 1500 (600 Ca) MG TABS tablet Take by mouth.    [provider]  Cholecalciferol (VITAMIN D3) 10 MCG (400 UNIT) CAPS Take 50 mcg by mouth daily. 11/25/21   [provider]  losartan-hydrochlorothiazide Konrad Penta) 100-12.5 MG tablet  11/25/21   [provider]  lovastatin (MEVACOR) 40 MG tablet TAKE 1 TABLET BY MOUTH  DAILY WITH DINNER 07/10/14   [provider]  vitamin B-12 (CYANOCOBALAMIN) 1000 MCG tablet Take by mouth.    [provider]    Allergies as of 11/27/2021   (Not on File)    Family History  Problem Relation Age of Onset   Breast cancer Mother 37    Social History   Socioeconomic History   Marital status: Married    Spouse name: Not on file   Number of children: Not on file   Years of education: Not on file   Highest education level: Not on file  Occupational History   Not on file  Tobacco Use   Smoking status: Former    Types: Cigarettes    Quit date: 2015    Years since quitting: 8.8   Smokeless tobacco: Never  Vaping Use   Vaping Use:  Never used  Substance and Sexual Activity   Alcohol use: Yes    Alcohol/week: 7.0 standard drinks of alcohol    Types: 7 Standard drinks or equivalent per week   Drug use: Never   Sexual activity: Not on file  Other Topics Concern   Not on file  Social History Narrative   Not on file   Social Determinants of Health   Financial Resource Strain: Not on file  Food Insecurity: Not on file  Transportation Needs: Not on file  Physical Activity: Not on file  Stress: Not on file  Social Connections: Not on file  Intimate Partner Violence: Not on file    Review of Systems: See HPI, otherwise negative ROS  Physical Exam: There were no vitals taken for this visit. General:   Alert,  pleasant and cooperative in NAD Head:  Normocephalic and atraumatic. Lungs:  Clear to auscultation.    Heart:  Regular rate and rhythm.   Impression/Plan: Kelsey Hess is here for ophthalmic surgery.  Risks, benefits, limitations, and alternatives regarding ophthalmic surgery have been reviewed with the patient.  Questions have been answered.  All parties agreeable.   Leandrew Koyanagi, MD  03/05/2022, 12:21 PM

## 2022-03-06 ENCOUNTER — Encounter: Payer: Self-pay | Admitting: Ophthalmology

## 2022-09-15 ENCOUNTER — Other Ambulatory Visit: Payer: Self-pay | Admitting: Internal Medicine

## 2022-09-15 DIAGNOSIS — Z1231 Encounter for screening mammogram for malignant neoplasm of breast: Secondary | ICD-10-CM

## 2022-10-15 ENCOUNTER — Ambulatory Visit
Admission: RE | Admit: 2022-10-15 | Discharge: 2022-10-15 | Disposition: A | Discharge status: Home / Self Care | Payer: Medicare Other | Source: Ambulatory Visit | Attending: Internal Medicine | Admitting: Internal Medicine

## 2022-10-15 DIAGNOSIS — Z1231 Encounter for screening mammogram for malignant neoplasm of breast: Secondary | ICD-10-CM | POA: Diagnosis present

## 2022-10-20 ENCOUNTER — Other Ambulatory Visit: Payer: Self-pay | Admitting: Internal Medicine

## 2022-10-20 DIAGNOSIS — R921 Mammographic calcification found on diagnostic imaging of breast: Secondary | ICD-10-CM

## 2022-10-20 DIAGNOSIS — R928 Other abnormal and inconclusive findings on diagnostic imaging of breast: Secondary | ICD-10-CM

## 2022-10-21 ENCOUNTER — Other Ambulatory Visit: Payer: Self-pay | Admitting: Internal Medicine

## 2022-10-21 ENCOUNTER — Ambulatory Visit
Admission: RE | Admit: 2022-10-21 | Discharge: 2022-10-21 | Disposition: A | Discharge status: Home / Self Care | Payer: Medicare Other | Source: Ambulatory Visit | Attending: Internal Medicine | Admitting: Internal Medicine

## 2022-10-21 DIAGNOSIS — R928 Other abnormal and inconclusive findings on diagnostic imaging of breast: Secondary | ICD-10-CM | POA: Diagnosis not present

## 2022-10-21 DIAGNOSIS — R921 Mammographic calcification found on diagnostic imaging of breast: Secondary | ICD-10-CM

## 2022-10-28 ENCOUNTER — Other Ambulatory Visit: Payer: Self-pay | Admitting: Family Medicine

## 2022-10-28 DIAGNOSIS — R921 Mammographic calcification found on diagnostic imaging of breast: Secondary | ICD-10-CM

## 2022-10-28 DIAGNOSIS — R928 Other abnormal and inconclusive findings on diagnostic imaging of breast: Secondary | ICD-10-CM

## 2022-11-04 ENCOUNTER — Ambulatory Visit
Admission: RE | Admit: 2022-11-04 | Discharge: 2022-11-04 | Disposition: A | Discharge status: Home / Self Care | Payer: Medicare Other | Source: Ambulatory Visit | Attending: Family Medicine | Admitting: Family Medicine

## 2022-11-04 DIAGNOSIS — R928 Other abnormal and inconclusive findings on diagnostic imaging of breast: Secondary | ICD-10-CM

## 2022-11-04 DIAGNOSIS — R921 Mammographic calcification found on diagnostic imaging of breast: Secondary | ICD-10-CM

## 2022-11-04 HISTORY — PX: BREAST BIOPSY: SHX20

## 2022-11-04 MED ORDER — LIDOCAINE HCL 1 % IJ SOLN
15.0000 mL | Freq: Once | INTRAMUSCULAR | Status: AC
  Administered 2022-11-04: 15 mL
  Filled 2022-11-04: qty 15

## 2022-11-04 MED ORDER — LIDOCAINE-EPINEPHRINE 1 %-1:100000 IJ SOLN
10.0000 mL | Freq: Once | INTRAMUSCULAR | Status: AC
  Administered 2022-11-04: 10 mL
  Filled 2022-11-04: qty 10

## 2023-11-05 ENCOUNTER — Other Ambulatory Visit: Payer: Self-pay | Admitting: Internal Medicine

## 2023-11-05 DIAGNOSIS — Z1231 Encounter for screening mammogram for malignant neoplasm of breast: Secondary | ICD-10-CM

## 2023-11-17 ENCOUNTER — Ambulatory Visit
Admission: RE | Admit: 2023-11-17 | Discharge: 2023-11-17 | Disposition: A | Discharge status: Home / Self Care | Source: Ambulatory Visit | Attending: Internal Medicine | Admitting: Internal Medicine

## 2023-11-17 DIAGNOSIS — Z1231 Encounter for screening mammogram for malignant neoplasm of breast: Secondary | ICD-10-CM | POA: Diagnosis present
